# Patient Record
Sex: Female | Born: 1980 | Race: Black or African American | Hispanic: No | Marital: Married | Smoking: Never smoker
Health system: Southern US, Community
[De-identification: ages and names within clinical notes are randomized; demographics above are authoritative.]

## PROBLEM LIST (undated history)

## (undated) DIAGNOSIS — J45909 Unspecified asthma, uncomplicated: Secondary | ICD-10-CM

## (undated) DIAGNOSIS — M1990 Unspecified osteoarthritis, unspecified site: Secondary | ICD-10-CM

---

## 2016-11-24 ENCOUNTER — Emergency Department (HOSPITAL_COMMUNITY): Payer: Self-pay

## 2016-11-24 ENCOUNTER — Emergency Department (HOSPITAL_COMMUNITY)
Admission: EM | Admit: 2016-11-24 | Discharge: 2016-11-24 | Disposition: A | Discharge status: Home / Self Care | Payer: Self-pay | Attending: Emergency Medicine | Admitting: Emergency Medicine

## 2016-11-24 ENCOUNTER — Encounter (HOSPITAL_COMMUNITY): Payer: Self-pay | Admitting: Emergency Medicine

## 2016-11-24 DIAGNOSIS — J45909 Unspecified asthma, uncomplicated: Secondary | ICD-10-CM | POA: Insufficient documentation

## 2016-11-24 DIAGNOSIS — R519 Headache, unspecified: Secondary | ICD-10-CM

## 2016-11-24 DIAGNOSIS — M5481 Occipital neuralgia: Secondary | ICD-10-CM | POA: Insufficient documentation

## 2016-11-24 DIAGNOSIS — R930 Abnormal findings on diagnostic imaging of skull and head, not elsewhere classified: Secondary | ICD-10-CM | POA: Insufficient documentation

## 2016-11-24 DIAGNOSIS — M436 Torticollis: Secondary | ICD-10-CM | POA: Insufficient documentation

## 2016-11-24 DIAGNOSIS — R51 Headache: Secondary | ICD-10-CM

## 2016-11-24 HISTORY — DX: Unspecified asthma, uncomplicated: J45.909

## 2016-11-24 HISTORY — DX: Unspecified osteoarthritis, unspecified site: M19.90

## 2016-11-24 LAB — CBC WITH DIFFERENTIAL/PLATELET
BASOS ABS: 0 10*3/uL (ref 0.0–0.1)
BASOS PCT: 0 %
EOS ABS: 0.2 10*3/uL (ref 0.0–0.7)
EOS PCT: 2 %
HCT: 34.8 % — ABNORMAL LOW (ref 36.0–46.0)
HEMOGLOBIN: 11.2 g/dL — AB (ref 12.0–15.0)
Lymphocytes Relative: 30 %
Lymphs Abs: 3.3 10*3/uL (ref 0.7–4.0)
MCH: 24.2 pg — ABNORMAL LOW (ref 26.0–34.0)
MCHC: 32.2 g/dL (ref 30.0–36.0)
MCV: 75.3 fL — ABNORMAL LOW (ref 78.0–100.0)
Monocytes Absolute: 0.7 10*3/uL (ref 0.1–1.0)
Monocytes Relative: 6 %
NEUTROS PCT: 62 %
Neutro Abs: 6.8 10*3/uL (ref 1.7–7.7)
PLATELETS: 259 10*3/uL (ref 150–400)
RBC: 4.62 MIL/uL (ref 3.87–5.11)
RDW: 15.6 % — ABNORMAL HIGH (ref 11.5–15.5)
WBC: 11 10*3/uL — AB (ref 4.0–10.5)

## 2016-11-24 LAB — CSF CELL COUNT WITH DIFFERENTIAL
RBC COUNT CSF: 3845 /mm3 — AB
RBC COUNT CSF: 570 /mm3 — AB
TUBE #: 1
TUBE #: 4
WBC CSF: 2 /mm3 (ref 0–5)
WBC, CSF: 3 /mm3 (ref 0–5)

## 2016-11-24 LAB — BASIC METABOLIC PANEL
Anion gap: 7 (ref 5–15)
BUN: 9 mg/dL (ref 6–20)
CHLORIDE: 104 mmol/L (ref 101–111)
CO2: 27 mmol/L (ref 22–32)
CREATININE: 0.65 mg/dL (ref 0.44–1.00)
Calcium: 8.9 mg/dL (ref 8.9–10.3)
Glucose, Bld: 106 mg/dL — ABNORMAL HIGH (ref 65–99)
POTASSIUM: 3.6 mmol/L (ref 3.5–5.1)
SODIUM: 138 mmol/L (ref 135–145)

## 2016-11-24 LAB — PROTEIN, CSF: TOTAL PROTEIN, CSF: 34 mg/dL (ref 15–45)

## 2016-11-24 LAB — GLUCOSE, CSF: GLUCOSE CSF: 50 mg/dL (ref 40–70)

## 2016-11-24 MED ORDER — BETAMETHASONE SOD PHOS & ACET 6 (3-3) MG/ML IJ SUSP
12.0000 mg | Freq: Once | INTRAMUSCULAR | Status: AC
  Administered 2016-11-24: 12 mg via INTRAMUSCULAR
  Filled 2016-11-24: qty 2

## 2016-11-24 MED ORDER — HYDROMORPHONE HCL 1 MG/ML IJ SOLN
1.0000 mg | Freq: Once | INTRAMUSCULAR | Status: AC
  Administered 2016-11-24: 1 mg via INTRAVENOUS
  Filled 2016-11-24: qty 1

## 2016-11-24 MED ORDER — DIAZEPAM 5 MG/ML IJ SOLN
5.0000 mg | Freq: Once | INTRAMUSCULAR | Status: AC
  Administered 2016-11-24: 5 mg via INTRAVENOUS
  Filled 2016-11-24: qty 2

## 2016-11-24 MED ORDER — HYDROCODONE-ACETAMINOPHEN 5-325 MG PO TABS
1.0000 | ORAL_TABLET | Freq: Once | ORAL | Status: AC
  Administered 2016-11-24: 1 via ORAL
  Filled 2016-11-24: qty 1

## 2016-11-24 MED ORDER — IOPAMIDOL (ISOVUE-370) INJECTION 76%
INTRAVENOUS | Status: AC
  Administered 2016-11-24: 50 mL
  Filled 2016-11-24: qty 50

## 2016-11-24 MED ORDER — MELOXICAM 15 MG PO TABS: 15.0000 mg | ORAL_TABLET | Freq: Every day | ORAL | 0 refills | Status: AC

## 2016-11-24 MED ORDER — BUPIVACAINE HCL 0.25 % IJ SOLN
10.0000 mL | Freq: Once | INTRAMUSCULAR | Status: AC
  Administered 2016-11-24: 10 mL
  Filled 2016-11-24: qty 10

## 2016-11-24 MED ORDER — ONDANSETRON HCL 4 MG/2ML IJ SOLN
4.0000 mg | Freq: Once | INTRAMUSCULAR | Status: AC
  Administered 2016-11-24: 4 mg via INTRAVENOUS
  Filled 2016-11-24: qty 2

## 2016-11-24 MED ORDER — KETOROLAC TROMETHAMINE 30 MG/ML IJ SOLN
30.0000 mg | Freq: Once | INTRAMUSCULAR | Status: AC
Start: 2016-11-24 — End: 2016-11-24
  Administered 2016-11-24: 30 mg via INTRAVENOUS
  Filled 2016-11-24: qty 1

## 2016-11-24 MED ORDER — GADOBENATE DIMEGLUMINE 529 MG/ML IV SOLN
20.0000 mL | Freq: Once | INTRAVENOUS | Status: AC | PRN
  Administered 2016-11-24: 20 mL via INTRAVENOUS

## 2016-11-24 MED ORDER — BACLOFEN 20 MG PO TABS
20.0000 mg | ORAL_TABLET | Freq: Three times a day (TID) | ORAL | Status: DC
  Administered 2016-11-24: 20 mg via ORAL
  Filled 2016-11-24 (×2): qty 1

## 2016-11-24 MED ORDER — TRAMADOL HCL 50 MG PO TABS: 50.0000 mg | ORAL_TABLET | Freq: Four times a day (QID) | ORAL | 0 refills | Status: AC | PRN

## 2016-11-24 MED ORDER — PROMETHAZINE HCL 25 MG/ML IJ SOLN
25.0000 mg | Freq: Once | INTRAMUSCULAR | Status: AC
  Administered 2016-11-24: 25 mg via INTRAVENOUS
  Filled 2016-11-24: qty 1

## 2016-11-24 MED ORDER — LIDOCAINE HCL (PF) 1 % IJ SOLN
5.0000 mL | Freq: Once | INTRAMUSCULAR | Status: AC
  Administered 2016-11-24: 5 mL via INTRADERMAL
  Filled 2016-11-24: qty 5

## 2016-11-24 MED ORDER — METHOCARBAMOL 500 MG PO TABS
750.0000 mg | ORAL_TABLET | Freq: Once | ORAL | Status: AC
  Administered 2016-11-24: 750 mg via ORAL
  Filled 2016-11-24: qty 2

## 2016-11-24 MED ORDER — BACLOFEN 10 MG PO TABS: 10.0000 mg | ORAL_TABLET | Freq: Three times a day (TID) | ORAL | 0 refills | Status: AC

## 2016-11-24 NOTE — ED Provider Notes (Signed)
10:50 AM BP (!) 147/92   Pulse 72   Temp 97.5 F (36.4 C) (Oral)   Resp 16   LMP 11/24/2016   SpO2 100%  Patient with severe HA- CT-CTA showed vasculitis LP is pending. MRI/MRA- w/wo pending  Will need to consult Neurology. Patient's opening pressure was also 28.   10:50 AM  BP (!) 147/92   Pulse 72   Temp 97.5 F (36.4 C) (Oral)   Resp 16   LMP 11/24/2016   SpO2 100%   Patient here with headache. Her MRI/MRA are negative for any abnormality including a vasculitis. Patient's opening pressure on lumbar puncture was 28. I spoke with Dr. Roxy Mannsster . He states that this is not high enough to qualify for idiopathic intracranial hypertension. Of note, the patient's white blood cell count was higher on the fourth tube, and I spoke with Roxy Horsemanobert Browning. He states that he readjusted the needle just prior to collecting the fourth tube and noticed it was more blood and felt that this was positional.  Dr. Roxy Mannsster feels that the patient should be treated as a migraine headache.  On my evaluation the patient has significant paioutpatient, especially in the he right cervical paraspinal muscles. She is complaining of pain that radiates around the scalp and is sharp. I question if this is potentially an occipital neuralgia. I ordered Celestone and lidocaine and patient has agreed to try an occipital nerve block. Patient will also be given baclofen.      10:50 AM .Nerve Block Date/Time: 11/24/2016 10:44 AM Performed by: Arthor CaptainHARRIS, Starlyn Droge Authorized by: Arthor CaptainHARRIS, Keen Ewalt   Consent:    Consent obtained:  Verbal   Consent given by:  Patient   Risks discussed:  Infection, allergic reaction, bleeding, pain, swelling and unsuccessful block   Alternatives discussed:  No treatment Indications:    Indications:  Pain relief Location:    Body area:  Head   Head nerve blocked: Greater and lesser occipital. Pre-procedure details:    Skin preparation:  2% chlorhexidine   Preparation: Patient was prepped and  draped in usual sterile fashion   Procedure details (see MAR for exact dosages):    Block needle gauge:  25 G   Anesthetic injected:  Bupivacaine 0.25% w/o epi   Steroid injected:  Betamethasone   Injection procedure:  Anatomic landmarks identified and negative aspiration for blood Post-procedure details:    Dressing: bandaid.   Outcome: pain and ROM improved.   Patient tolerated the procedure well. I do think this represents acute torticollis and potentially occipital neuralgia given the shooting pain. She feels around the right scalp. Patient treated with betamethasone and bupivacaine nerve block. Patient given baclofen and tramadol along with Mobitz at discharge. I have given patient discharge instructions and return precautions regarding her lumbar puncture, headache and occipital neuralgia findings. She otherwise appears medically clear for any emergent cause of headache at this time.    Arthor CaptainHarris, Orlan Aversa, PA-C 11/24/16 1052    Derwood KaplanNanavati, Ankit, MD 11/25/16 [phone]

## 2016-11-24 NOTE — ED Notes (Signed)
Patient transported to CT 

## 2016-11-24 NOTE — ED Triage Notes (Signed)
Pt presents with neck stiffness and pain x 2 days; pt reports taking OTC pain relievers without improvement; pt denies injury, fever, n/v

## 2016-11-24 NOTE — ED Notes (Signed)
Patient at MRI 

## 2016-11-24 NOTE — ED Provider Notes (Addendum)
Patient signed out to me by Damian Leavell, NP.  Patient with severe headache. Worse than she has ever experienced before.  CT angio cervical spine and head ordered. We'll follow up on these tests.  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 Will consult neurology.    Spoke with Dr. Otelia Limes, who recommends MRA and MRI and LP.    Physical Exam  BP (!) 146/84   Pulse 82   Temp 97.5 F (36.4 C) (Oral)   Resp 16   LMP 11/24/2016   SpO2 100%   Physical Exam  Constitutional: She is oriented to person, place, and time. She appears well-developed and well-nourished.  HENT:  Head: Normocephalic and atraumatic.  Right Ear: External ear normal.  Left Ear: External ear normal.  Eyes: Conjunctivae and EOM are normal. Pupils are equal, round, and reactive to light.  Neck: Normal range of motion. Neck supple.  No pain with neck flexion, no meningismus  Cardiovascular: Normal rate, regular rhythm and normal heart sounds.  Exam reveals no gallop and no friction rub.   No murmur heard. Pulmonary/Chest: Effort normal and breath sounds normal. No respiratory distress. She has no wheezes. She has no rales. She exhibits no tenderness.  Abdominal: Soft. She exhibits no distension and no mass. There is no tenderness. There is no rebound and no guarding.  Musculoskeletal: Normal range of motion. She exhibits no edema or tenderness.  Normal gait.  Neurological: She is alert and oriented to person, place, and time. She has normal reflexes.  CN 3-12 intact, normal finger to nose, no pronator drift, sensation and strength intact bilaterally.  Skin: Skin is warm and dry.  Psychiatric: She has a normal mood and affect. Her behavior is normal. Judgment and thought content normal.  Nursing note and vitals reviewed.   ED Course  .Lumbar Puncture Date/Time: 11/24/2016 6:02 AM Performed by: Roxy Horseman Authorized by: Roxy Horseman   Consent:    Consent obtained:  Verbal  Consent given by:  Patient   Risks discussed:  Headache   Alternatives discussed:  No treatment Pre-procedure details:    Procedure purpose:  Diagnostic   Preparation: Patient was prepped and draped in usual sterile fashion   Anesthesia (see MAR for exact dosages):    Anesthesia method:  Local infiltration   Local anesthetic:  Lidocaine 1% w/o epi Procedure details:    Lumbar space:  L4-L5 interspace   Patient position:  L lateral decubitus   Needle gauge:  18   Needle type:  Spinal needle - Quincke tip   Needle length (in):  3.5   Ultrasound guidance: no     Number of attempts:  1   Opening pressure (cm H2O):  28   Closing pressure (cm H2O):  26   Fluid appearance:  Clear   Tubes of fluid:  4   Total volume (ml):  4 Post-procedure:    Puncture site:  Adhesive bandage applied   Patient tolerance of procedure:  Tolerated well, no immediate complications    MDM  Patient with findings concerning for vasculitis. Neurology recommends additional imaging as lumbar puncture.  Patient signed out to oncoming team Harris, PA-C.  Will need to speak with neurology following MRI results.  Consider also pseudotumor cerebri given elevated opening pressure of 28.  Tube 4 was rose colored and CSF had ceased flowing.  This is thought to be due to patient repositioning and needle likely coming out of canal.  Roxy HorsemanBrowning, Bryar Dahms, PA-C 11/24/16 2225    Ward, Layla MawKristen N, DO 11/27/16 Newton [id number]

## 2016-11-24 NOTE — Discharge Instructions (Signed)

## 2016-11-24 NOTE — ED Notes (Signed)
Patient requesting pain med. PA aware.

## 2016-11-24 NOTE — ED Notes (Signed)
Patient transported to MRI 

## 2016-11-24 NOTE — Consult Note (Signed)
NEURO HOSPITALIST CONSULT NOTE   Requestig physician: Dr. Elesa Massed  Reason for Consult: Severe neck pain progressing to include severe right sided headache  History obtained from:   Patient and Chart   HPI:                                                                                                                                          Angela Cherry is an 36 y.o. female who presented to the ED with severe right sided neck pain that started on Saturday and then worsened to include severe right posterior and lateral headache on the right. The pain is 10/10, occasionally pounding and described as feeling like her head is going to explode. Has had photophobia. Denies weakness, numbness, vision loss, confusion, chest pain, SOB or limb pain. She has a prior history of migraine headaches but states that this headache is different.   CTA head and neck was obtained and was somewhat technically compromised per Radiology, with question of luminal irregularity within the bilateral MCAs and ACAs suggestive of possible vasculitis.   Subsequently, MRI brain was obtained, which was negative. MRA of head also revealed normal arterial appearance without evidence of vasculitis.   LP was performed by ED physician, revealing an elevated opening pressure of 28. Labs were obtained to assess for possible vasculitis.   Past Medical History:  Diagnosis Date  . Arthritis   . Asthma     History reviewed. No pertinent surgical history.  History reviewed. No pertinent family history.  Social History:  reports that she has never smoked. She has never used smokeless tobacco. She reports that she does not drink alcohol or use drugs.  Allergies  Allergen Reactions  . Penicillins Hives    MEDICATIONS:                                                                                                                     No home medications listed.   ROS:  As per HPI.   Blood pressure 122/83, pulse 60, temperature 97.5 F (36.4 C), temperature source Oral, resp. rate 16, last menstrual period 11/24/2016, SpO2 97 %.  General Examination:                                                                                                      HEENT-  [place]/AT. Severe TTP posterior nuchal musculature and temporalis muscles, right worse than left Lungs- Respirations unlabored Extremities- No edema.   Neurological Examination Mental Status: Alert, oriented, thought content appropriate.  Speech fluent without evidence of aphasia.  Able to follow 3 step commands without difficulty. Cranial Nerves: II: Visual intact, PERRL III,IV, VI: ptosis not present, EOMI without nystagmus or CN6 deficit V,VII: smile symmetric, facial temp sensation normal bilaterally VIII: hearing intact to voice IX,X: no hypophonia XI: symmetric shoulder shrug XII: midline tongue extension Motor: Right : Upper extremity   5/5    Left:     Upper extremity   5/5  Lower extremity   5/5     Lower extremity   5/5 Tone and bulk:normal tone throughout; no atrophy noted Sensory: Temp and light touch intact throughout, bilaterally Deep Tendon Reflexes: Normoactive x 4 Plantars: Right: downgoing   Left: downgoing Cerebellar: No ataxia with FNF bilaterally  Gait: Deferred  Lab Results: Basic Metabolic Panel:  Recent Labs Lab 11/24/16 0140  NA 138  K 3.6  CL 104  CO2 27  GLUCOSE 106*  BUN 9  CREATININE 0.65  CALCIUM 8.9    Liver Function Tests: No results for input(s): AST, ALT, ALKPHOS, BILITOT, PROT, ALBUMIN in the last 168 hours. No results for input(s): LIPASE, AMYLASE in the last 168 hours. No results for input(s): AMMONIA in the last 168 hours.  CBC:  Recent Labs Lab 11/24/16 0140  WBC 11.0*  NEUTROABS 6.8  HGB 11.2*  HCT 34.8*  MCV 75.3*  PLT 259     Cardiac Enzymes: No results for input(s): CKTOTAL, CKMB, CKMBINDEX, TROPONINI in the last 168 hours.  Lipid Panel: No results for input(s): CHOL, TRIG, HDL, CHOLHDL, VLDL, LDLCALC in the last 168 hours.  CBG: No results for input(s): GLUCAP in the last 168 hours.  Microbiology: Results for orders placed or performed during the hospital encounter of 11/24/16  CSF culture     Status: None (Preliminary result)   Collection Time: 11/24/16  4:24 AM  Result Value Ref Range Status   Specimen Description CSF  Final   Special Requests NONE  Final   Gram Stain   Final    WBC PRESENT, PREDOMINANTLY PMN NO ORGANISMS SEEN CYTOSPIN SMEAR    Culture PENDING  Incomplete   Report Status PENDING  Incomplete    Coagulation Studies: No results for input(s): LABPROT, INR in the last 72 hours.  Imaging: Ct Angio Head W Or Wo Contrast  Result Date: 11/24/2016 CLINICAL DATA:  Severe headache radiating to back of neck for 2 days. History of migraines. EXAM: CT ANGIOGRAPHY HEAD AND NECK TECHNIQUE: Multidetector CT imaging of the head and neck was performed using the standard protocol  during bolus administration of intravenous contrast. Multiplanar CT image reconstructions and MIPs were obtained to evaluate the vascular anatomy. Carotid stenosis measurements (when applicable) are obtained utilizing NASCET criteria, using the distal internal carotid diameter as the denominator. CONTRAST:  50 cc Isovue 370 COMPARISON:  None. FINDINGS: CT HEAD FINDINGS BRAIN: No intraparenchymal hemorrhage, mass effect nor midline shift. The ventricles and sulci are normal. No acute large vascular territory infarcts. No abnormal extra-axial fluid collections. Basal cisterns are patent. VASCULAR: Unremarkable. SKULL/SOFT TISSUES: No skull fracture. No significant soft tissue swelling. ORBITS/SINUSES: The included ocular globes and orbital contents are normal.The mastoid aircells and included paranasal sinuses are well-aerated.  OTHER: None. Large body habitus results in overall noisy image quality. CTA NECK- delayed venous phase. AORTIC ARCH: Normal appearance of the thoracic arch, 2 vessel arch is a normal variant. The origins of the innominate, left Common carotid artery and subclavian artery are widely patent. RIGHT CAROTID SYSTEM: Common carotid artery is widely patent. Normal appearance of the carotid bifurcation without hemodynamically significant stenosis by NASCET criteria. Normal appearance of the included internal carotid artery, retropharyngeal course may be transient. LEFT CAROTID SYSTEM: Common carotid artery is widely patent. Normal appearance of the carotid bifurcation without hemodynamically significant stenosis by NASCET criteria. Normal appearance of the included internal carotid artery, retropharyngeal course may be transient. VERTEBRAL ARTERIES:Left vertebral artery is dominant. Normal appearance of the vertebral arteries, which appear widely patent. SKELETON: No acute osseous process though bone windows have not been submitted. OTHER NECK: Soft tissues of the neck are non-acute though, not tailored for evaluation. CTA HEAD- delayed venous phase. ANTERIOR CIRCULATION: Patent cervical internal carotid arteries, petrous, cavernous and supra clinoid internal carotid arteries. Widely patent anterior communicating artery. Patent anterior and middle cerebral arteries with moderate luminal irregularity. No large vessel occlusion, hemodynamically significant stenosis, dissection, contrast extravasation or aneurysm. POSTERIOR CIRCULATION: Patent vertebral arteries, vertebrobasilar junction and basilar artery, as well as main branch vessels. Patent posterior cerebral arteries with moderate luminal irregularity. No large vessel occlusion, hemodynamically significant stenosis, dissection, contrast extravasation or aneurysm. VENOUS SINUSES: Major dural venous sinuses are patent though not tailored for evaluation on this angiographic  examination. ANATOMIC VARIANTS: None. DELAYED PHASE: No abnormal intracranial enhancement. MIP images reviewed. IMPRESSION: CT HEAD:  Normal. CTA NECK: Limited by body habitus and delayed phase, no hemodynamically significant stenosis or acute vascular process. CTA HEAD: Limited by delayed phase. Cerebral artery luminal irregularity concerning for vasculitis, less likely atherosclerosis. Recommend MRI of the head with and without contrast and MRA for further characterization. No emergent large vessel occlusion or severe stenosis. Electronically Signed   By: Awilda Metro M.D.   On: 11/24/2016 03:40   Ct Angio Neck W Or Wo Contrast  Result Date: 11/24/2016 CLINICAL DATA:  Severe headache radiating to back of neck for 2 days. History of migraines. EXAM: CT ANGIOGRAPHY HEAD AND NECK TECHNIQUE: Multidetector CT imaging of the head and neck was performed using the standard protocol during bolus administration of intravenous contrast. Multiplanar CT image reconstructions and MIPs were obtained to evaluate the vascular anatomy. Carotid stenosis measurements (when applicable) are obtained utilizing NASCET criteria, using the distal internal carotid diameter as the denominator. CONTRAST:  50 cc Isovue 370 COMPARISON:  None. FINDINGS: CT HEAD FINDINGS BRAIN: No intraparenchymal hemorrhage, mass effect nor midline shift. The ventricles and sulci are normal. No acute large vascular territory infarcts. No abnormal extra-axial fluid collections. Basal cisterns are patent. VASCULAR: Unremarkable. SKULL/SOFT TISSUES: No skull fracture. No significant soft tissue swelling.  ORBITS/SINUSES: The included ocular globes and orbital contents are normal.The mastoid aircells and included paranasal sinuses are well-aerated. OTHER: None. Large body habitus results in overall noisy image quality. CTA NECK- delayed venous phase. AORTIC ARCH: Normal appearance of the thoracic arch, 2 vessel arch is a normal variant. The origins of the  innominate, left Common carotid artery and subclavian artery are widely patent. RIGHT CAROTID SYSTEM: Common carotid artery is widely patent. Normal appearance of the carotid bifurcation without hemodynamically significant stenosis by NASCET criteria. Normal appearance of the included internal carotid artery, retropharyngeal course may be transient. LEFT CAROTID SYSTEM: Common carotid artery is widely patent. Normal appearance of the carotid bifurcation without hemodynamically significant stenosis by NASCET criteria. Normal appearance of the included internal carotid artery, retropharyngeal course may be transient. VERTEBRAL ARTERIES:Left vertebral artery is dominant. Normal appearance of the vertebral arteries, which appear widely patent. SKELETON: No acute osseous process though bone windows have not been submitted. OTHER NECK: Soft tissues of the neck are non-acute though, not tailored for evaluation. CTA HEAD- delayed venous phase. ANTERIOR CIRCULATION: Patent cervical internal carotid arteries, petrous, cavernous and supra clinoid internal carotid arteries. Widely patent anterior communicating artery. Patent anterior and middle cerebral arteries with moderate luminal irregularity. No large vessel occlusion, hemodynamically significant stenosis, dissection, contrast extravasation or aneurysm. POSTERIOR CIRCULATION: Patent vertebral arteries, vertebrobasilar junction and basilar artery, as well as main branch vessels. Patent posterior cerebral arteries with moderate luminal irregularity. No large vessel occlusion, hemodynamically significant stenosis, dissection, contrast extravasation or aneurysm. VENOUS SINUSES: Major dural venous sinuses are patent though not tailored for evaluation on this angiographic examination. ANATOMIC VARIANTS: None. DELAYED PHASE: No abnormal intracranial enhancement. MIP images reviewed. IMPRESSION: CT HEAD:  Normal. CTA NECK: Limited by body habitus and delayed phase, no  hemodynamically significant stenosis or acute vascular process. CTA HEAD: Limited by delayed phase. Cerebral artery luminal irregularity concerning for vasculitis, less likely atherosclerosis. Recommend MRI of the head with and without contrast and MRA for further characterization. No emergent large vessel occlusion or severe stenosis. Electronically Signed   By: Awilda Metro M.D.   On: 11/24/2016 03:40   Mr Angiogram Head W Or Wo Contrast  Result Date: 11/24/2016 CLINICAL DATA:  36 year old female with severe headache radiating to the neck for 2 days. Query vasculitis. EXAM: MRI HEAD WITHOUT AND WITH CONTRAST MRA HEAD WITHOUT CONTRAST TECHNIQUE: Multiplanar, multiecho pulse sequences of the brain and surrounding structures were obtained without and with intravenous contrast. Angiographic images of the head were obtained using MRA technique without contrast. CONTRAST:  20mL MULTIHANCE GADOBENATE DIMEGLUMINE 529 MG/ML IV SOLN COMPARISON:  CT head without contrast, and CTA head and neck from 0253 hours today. FINDINGS: MRI HEAD FINDINGS Brain: Cerebral volume is within normal limits. No restricted diffusion to suggest acute infarction. No midline shift, mass effect, evidence of mass lesion, ventriculomegaly, extra-axial collection or acute intracranial hemorrhage. Cervicomedullary junction and pituitary are within normal limits. Wallace Cullens and white matter signal is within normal limits throughout the brain. No encephalomalacia or chronic cerebral blood products identified. No abnormal enhancement identified. No dural thickening. Vascular: Major intracranial vascular flow voids are preserved. Major dural venous sinuses are patent and enhancing. Skull and upper cervical spine: Mild degenerative appearing ligamentous hypertrophy about the odontoid. Otherwise negative. Visualized bone marrow signal is within normal limits. Sinuses/Orbits: Orbits soft tissues are within normal limits. Visualized paranasal sinuses and  mastoids are stable and well pneumatized. Other: Visible internal auditory structures appear normal. Negative scalp soft tissues. MRA HEAD  FINDINGS Intracranial arterial detail degraded by motion artifact, but primarily at the skullbase. The medium size vessel detail is better preserved. Antegrade flow in the posterior circulation with mildly dominant distal left vertebral artery. No distal vertebral or vertebrobasilar stenosis. Mildly tortuous basilar artery without stenosis. SCA and PCA origins are normal. Posterior communicating arteries are diminutive or absent. Bilateral PCA branches appear normal. Antegrade flow in both ICA siphons. Mildly tortuous ICAs. No siphon stenosis. Patent carotid termini. Normal MCA and ACA origins. Anterior communicating artery and visible ACA branches are normal. Left MCA M1 segment, bifurcation, and left MCA branches appear normal. Right MCA bifurcates early. Right MCA M1 and visible right MCA branches appear normal. IMPRESSION: 1. Normal arterial appearance on intracranial MRA, without evidence of vasculitis. No intracranial stenosis or aneurysm. 2.  Normal MRI appearance of the brain. Electronically Signed   By: Odessa FlemingH  Hall M.D.   On: 11/24/2016 07:47   Mr Laqueta JeanBrain W And Wo Contrast  Result Date: 11/24/2016 CLINICAL DATA:  36 year old female with severe headache radiating to the neck for 2 days. Query vasculitis. EXAM: MRI HEAD WITHOUT AND WITH CONTRAST MRA HEAD WITHOUT CONTRAST TECHNIQUE: Multiplanar, multiecho pulse sequences of the brain and surrounding structures were obtained without and with intravenous contrast. Angiographic images of the head were obtained using MRA technique without contrast. CONTRAST:  20mL MULTIHANCE GADOBENATE DIMEGLUMINE 529 MG/ML IV SOLN COMPARISON:  CT head without contrast, and CTA head and neck from 0253 hours today. FINDINGS: MRI HEAD FINDINGS Brain: Cerebral volume is within normal limits. No restricted diffusion to suggest acute infarction. No  midline shift, mass effect, evidence of mass lesion, ventriculomegaly, extra-axial collection or acute intracranial hemorrhage. Cervicomedullary junction and pituitary are within normal limits. Wallace CullensGray and white matter signal is within normal limits throughout the brain. No encephalomalacia or chronic cerebral blood products identified. No abnormal enhancement identified. No dural thickening. Vascular: Major intracranial vascular flow voids are preserved. Major dural venous sinuses are patent and enhancing. Skull and upper cervical spine: Mild degenerative appearing ligamentous hypertrophy about the odontoid. Otherwise negative. Visualized bone marrow signal is within normal limits. Sinuses/Orbits: Orbits soft tissues are within normal limits. Visualized paranasal sinuses and mastoids are stable and well pneumatized. Other: Visible internal auditory structures appear normal. Negative scalp soft tissues. MRA HEAD FINDINGS Intracranial arterial detail degraded by motion artifact, but primarily at the skullbase. The medium size vessel detail is better preserved. Antegrade flow in the posterior circulation with mildly dominant distal left vertebral artery. No distal vertebral or vertebrobasilar stenosis. Mildly tortuous basilar artery without stenosis. SCA and PCA origins are normal. Posterior communicating arteries are diminutive or absent. Bilateral PCA branches appear normal. Antegrade flow in both ICA siphons. Mildly tortuous ICAs. No siphon stenosis. Patent carotid termini. Normal MCA and ACA origins. Anterior communicating artery and visible ACA branches are normal. Left MCA M1 segment, bifurcation, and left MCA branches appear normal. Right MCA bifurcates early. Right MCA M1 and visible right MCA branches appear normal. IMPRESSION: 1. Normal arterial appearance on intracranial MRA, without evidence of vasculitis. No intracranial stenosis or aneurysm. 2.  Normal MRI appearance of the brain. Electronically Signed    By: Odessa FlemingH  Hall M.D.   On: 11/24/2016 07:47    Assessment: 1. Severe neck pain and right sided headache. DDx includes severe migraine headache, but most likely etiology is pseudotumor cerebri (morbidly obese with elevated OP of 28). A component of the pain is also most likely due to muscle spasm.  2. CSF was bloody tap,  but cell count and protein not consistent with vasculitis. IgG index is pending.  3. Follow up MRI/MRA are also not consistent with vasculitis.   Recommendations: 1. Phenergan 25 mg IV x 1 2. Toradol 30 mg IV x 1 3. Valium 5 mg IV x 1 4. Consider a trial of Diamox 5. Send to ophthalmology for measurement of visual fields with [facility] perimetry, in addition to retinal exam 6. Follow up should be scheduled with Neurology in 1-2 weeks for second opinion regarding pseudotumor cerebri, as well as to follow up IgG index (if IgG index is not resulted by the time she is discharged) 7. Referral to weight loss clinic 8. Vitamin A level  Electronically signed: Dr. Caryl Pina 11/24/2016, 8:54 AM

## 2016-11-24 NOTE — ED Provider Notes (Signed)
[facility]-EMERGENCY DEPT Provider Note   CSN: [id number] Arrival date & time: 11/24/16  0022     History   Chief Complaint Chief Complaint  Patient presents with  . Torticollis    HPI Angela Cherry is a 36 y.o. female who presents to the ED with neck pain. She reports that the pain started yesterday and today is severe. She also complains of a severe headache that feels like it is going to explode. Patient states that she has occasional migraine headache but nothing like the pain she is currently having.   The history is provided by the patient. No language interpreter was used.    Past Medical History:  Diagnosis Date  . Arthritis     There are no active problems to display for this patient.   History reviewed. No pertinent surgical history.  OB History    No data available       Home Medications    Prior to Admission medications   Not on File    Family History History reviewed. No pertinent family history.  Social History Social History  Substance Use Topics  . Smoking status: Never Smoker  . Smokeless tobacco: Never Used  . Alcohol use No     Allergies   Penicillins   Review of Systems Review of Systems  Constitutional: Negative for chills and fever.  HENT: Negative for congestion, ear pain and sinus pressure.   Eyes: Negative for photophobia and visual disturbance.  Respiratory: Negative for cough.   Cardiovascular: Negative for chest pain.  Gastrointestinal: Negative for abdominal pain, nausea and vomiting.  Musculoskeletal: Positive for neck pain and neck stiffness.  Skin: Negative for wound.  Neurological: Positive for headaches.  Psychiatric/Behavioral: Negative for confusion.     Physical Exam Updated Vital Signs BP (!) 160/98 (BP Location: Right Arm)   Pulse 65   Temp 97.5 F (36.4 C) (Oral)   Resp 16   SpO2 100%   Physical Exam  Constitutional: She is oriented to person, place, and time. She appears well-developed and  well-nourished. No distress.  HENT:  Head: Normocephalic and atraumatic.  Right Ear: Tympanic membrane normal.  Left Ear: Tympanic membrane normal.  Nose: Nose normal.  Mouth/Throat: Uvula is midline, oropharynx is clear and moist and mucous membranes are normal.  Eyes: Conjunctivae and EOM are normal.  Neck: Spinous process tenderness and muscular tenderness present. Decreased range of motion present.  Patient will not look up or touch chin to chest due to severe pain.  Cardiovascular: Normal rate and regular rhythm.   Pulmonary/Chest: Effort normal. She has no wheezes. She has no rales.  Abdominal: There is no tenderness.  Musculoskeletal: She exhibits no edema.  Radial and pedal pulses strong, adequate circulation, good touch sensation.  Neurological: She is alert and oriented to person, place, and time. She has normal strength. She displays a negative Romberg sign. Gait normal.  Reflex Scores:      Bicep reflexes are 2+ on the right side and 2+ on the left side.      Brachioradialis reflexes are 2+ on the right side and 2+ on the left side.  Stands on one foot without difficulty.  Skin: Skin is warm and dry.  Psychiatric: She has a normal mood and affect. Her behavior is normal.  Nursing note and vitals reviewed.  After medication for pain and muscle spasm given I returned to reevaluate the patient. The patient was walking the floor holding her head and stating the pain was worse and  felt like her head was going to explode. With the increased pain, elevated BP and no response to the medication will order labs and CT.   ED Treatments / Results  Labs (all labs ordered are listed, but only abnormal results are displayed) Labs Reviewed  CBC WITH DIFFERENTIAL/PLATELET  BASIC METABOLIC PANEL    Radiology No results found.  Procedures Procedures (including critical care time)  Medications Ordered in ED Medications  methocarbamol (ROBAXIN) tablet 750 mg (750 mg Oral Given 11/24/16  0057)  HYDROcodone-acetaminophen (NORCO/VICODIN) 5-325 MG per tablet 1 tablet (1 tablet Oral Given 11/24/16 0057)     Initial Impression / Assessment and Plan / ED Course  I have reviewed the triage vital signs and the nursing notes.   Final Clinical Impressions(s) / ED Diagnoses   Final diagnoses:  Headache   Care turned over to Roxy Horseman, [facility] @ 2:00 am. Patient awaiting CT.   New Prescriptions New Prescriptions   No medications on file     Janne Napoleon, NP 11/24/16 0211    Ward, Layla Maw, DO 11/24/16 0300

## 2016-11-25 LAB — IGG CSF INDEX
ALBUMIN CSF-MCNC: 19 mg/dL (ref 11–48)
ALBUMIN: 3.7 g/dL (ref 3.5–5.5)
CSF IgG Index: 0.8 — ABNORMAL HIGH (ref 0.0–0.7)
IGG CSF: 4.7 mg/dL (ref 0.0–8.6)
IGG/ALB RATIO, CSF: 0.25 (ref 0.00–0.25)
IgG (Immunoglobin G), Serum: 1166 mg/dL (ref 700–1600)

## 2016-11-27 LAB — CSF CULTURE: CULTURE: NO GROWTH

## 2016-11-27 LAB — CSF CULTURE W GRAM STAIN

## 2017-09-25 IMAGING — MR MR HEAD WO/W CM
10 of 13 series · 28 of 48 positions shown · IV contrast (Yes MH)
Comparison: CT head without contrast, and CTA head and neck from
5386 hours today.

CLINICAL DATA: 35-year-old female with severe headache radiating to
the neck for 2 days. Query vasculitis.

EXAM:
MRI HEAD WITHOUT AND WITH CONTRAST
MRA HEAD WITHOUT CONTRAST
TECHNIQUE: Multiplanar, multiecho pulse sequences of the brain and surrounding
structures were obtained without and with intravenous contrast.
Angiographic images of the head were obtained using MRA technique
without contrast.
CONTRAST:  20mL MULTIHANCE GADOBENATE DIMEGLUMINE 529 MG/ML IV SOLN

[Series 3: FLAIR · sagittal · 5.0mm · 0.47mm/px · 2 of 23 slices shown (1 of 2)]
[im 1/23]
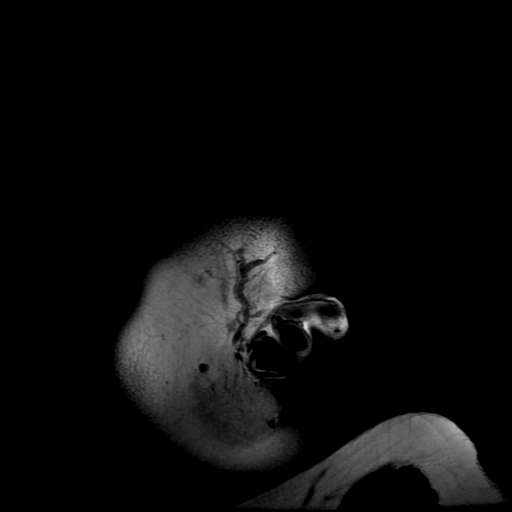
[im 23/23]
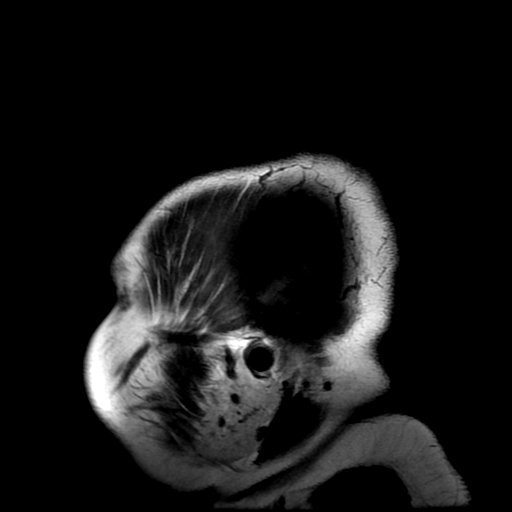

[Series 5: DWI · axial · 3.0mm · 0.94mm/px · z∈[-149,-4]mm · 6 of 100 slices shown (1 of 2)]
[im 1/100]
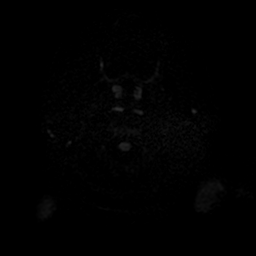
[im 20/100]
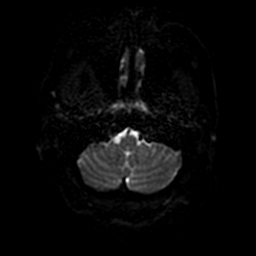
[im 40/100]
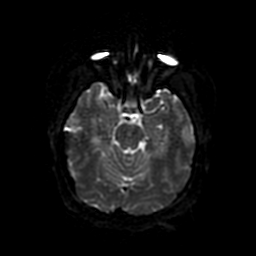
[im 60/100]
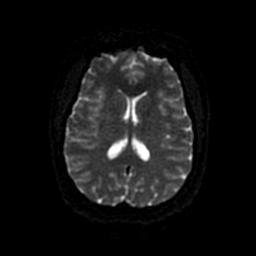
[im 80/100]
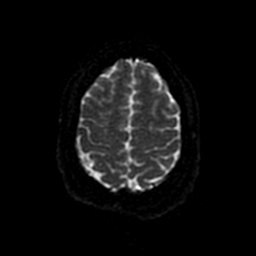
[im 100/100]
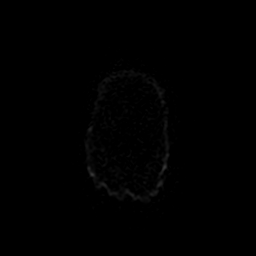

[Series 6: ax (id) 2 · axial · 1.0mm · 0.43mm/px · z∈[-133,-106]mm · 3 of 184 slices shown]
[im 1/184]
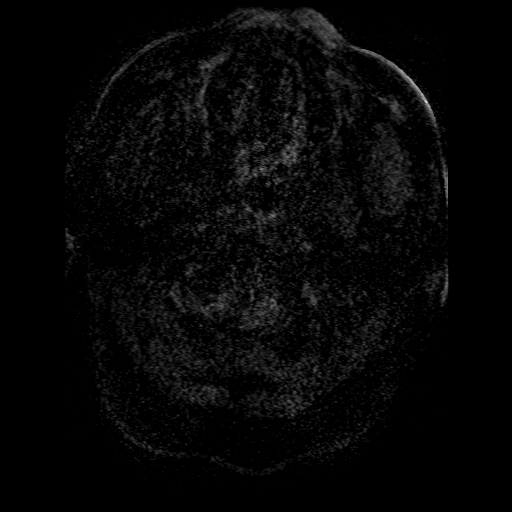
[im 37/184]
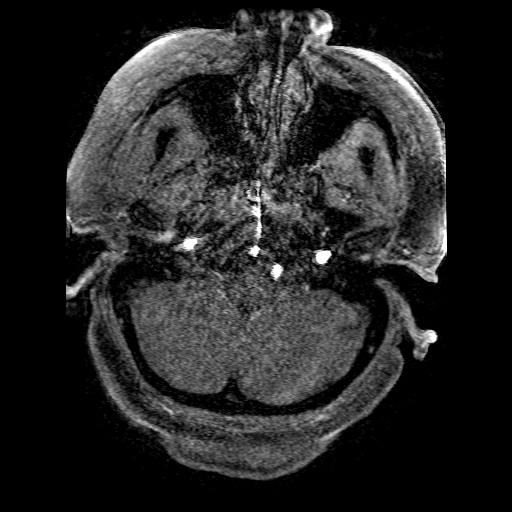
[im 55/184]
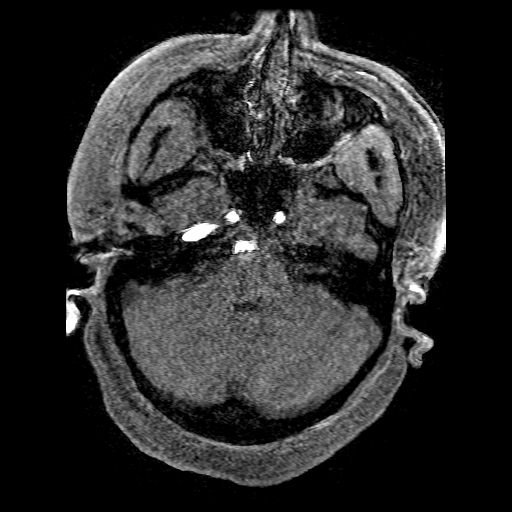

[Series 8: T2 · axial · 5.0mm · 0.47mm/px · z∈[-143,-1]mm · 2 of 25 slices shown]
[im 1/25]
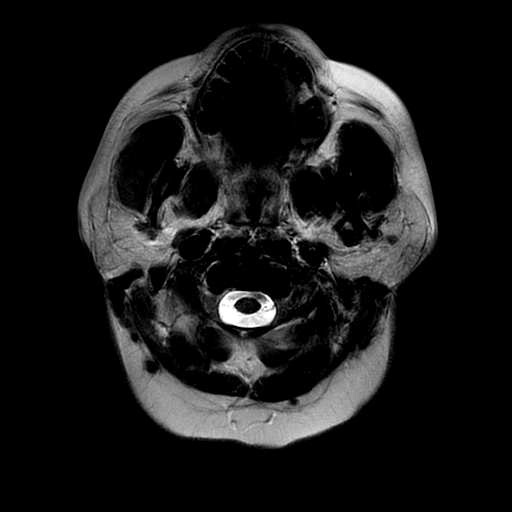
[im 25/25]
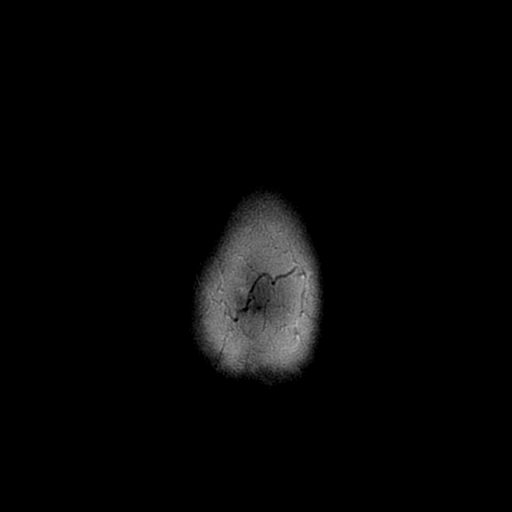

[Series 9: FLAIR · axial · 5.0mm · 0.47mm/px · z∈[-143,-1]mm · 2 of 25 slices shown (2 of 2)]
[im 1/25]
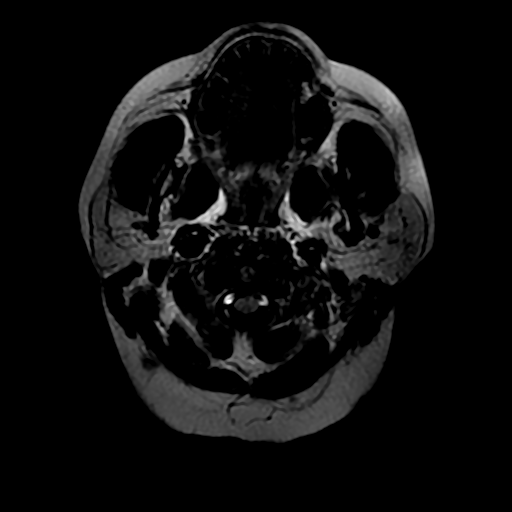
[im 25/25]
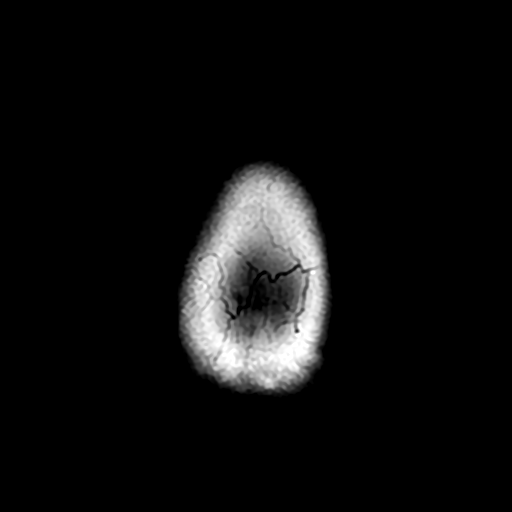

[Series 10: DWI · coronal · 4.0mm · 0.94mm/px · 4 of 72 slices shown (2 of 2)]
[im 1/72]
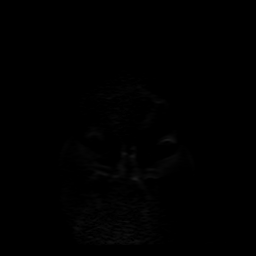
[im 24/72]
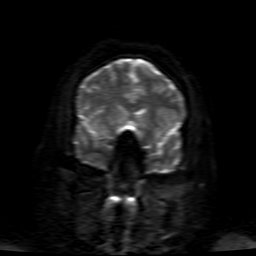
[im 48/72]
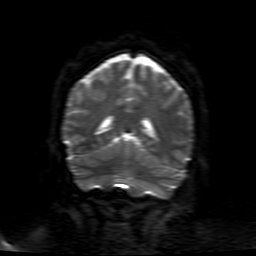
[im 72/72]
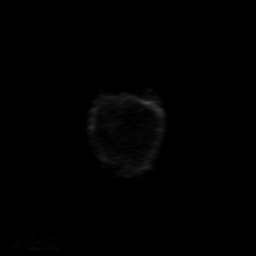

[Series 13: T2 post-contrast · coronal · 5.0mm · 0.47mm/px · 2 of 30 slices shown]
[im 1/30]
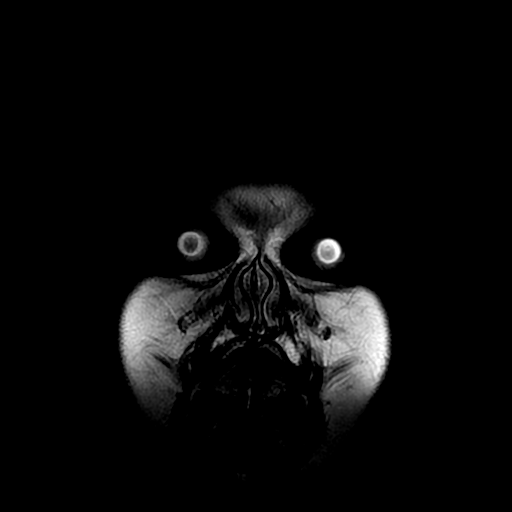
[im 30/30]
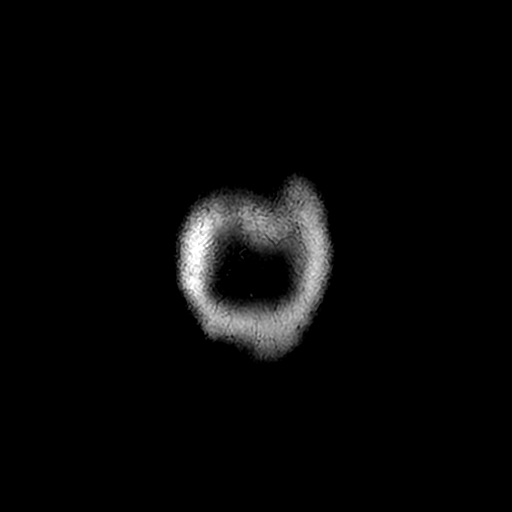

[Series 15: T1 · coronal · 5.0mm · 0.47mm/px · 2 of 30 slices shown]
[im 1/30]
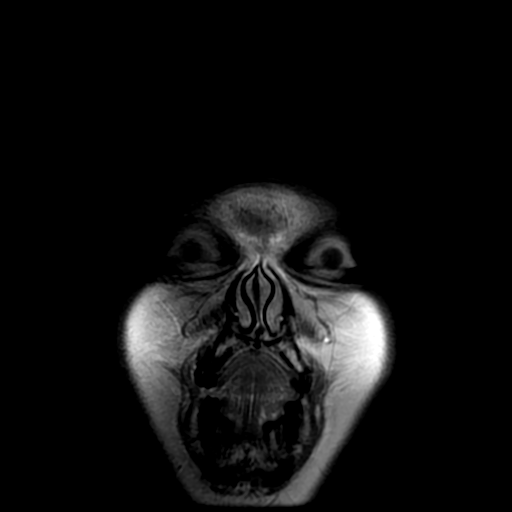
[im 30/30]
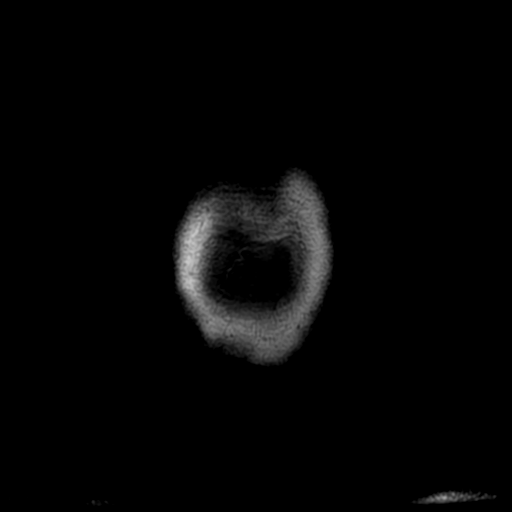

[Series 550: ADC · axial · 3.0mm · 0.94mm/px · z∈[-149,-4]mm · 3 of 50 slices shown (1 of 2)]
[im 1/50]
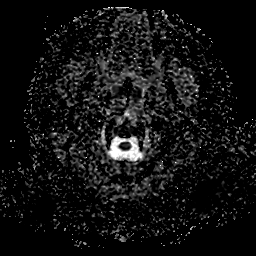
[im 25/50]
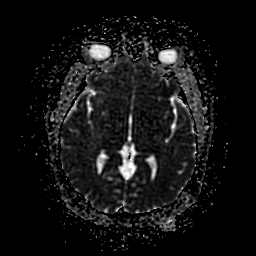
[im 50/50]
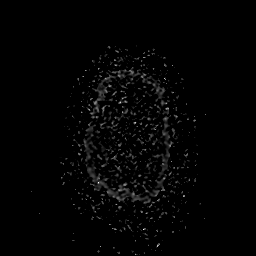

[Series 1050: ADC · coronal · 4.0mm · 0.94mm/px · 2 of 36 slices shown (2 of 2)]
[im 1/36]
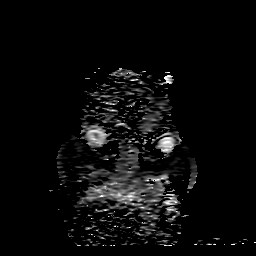
[im 36/36]
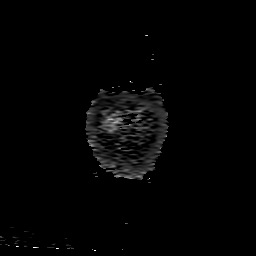

[28 of 48 positions shown; findings below may reference images not displayed]

FINDINGS: MRI HEAD FINDINGS

Brain: Cerebral volume is within normal limits. No restricted
diffusion to suggest acute infarction. No midline shift, mass
effect, evidence of mass lesion, ventriculomegaly, extra-axial
collection or acute intracranial hemorrhage. Cervicomedullary
junction and pituitary are within normal limits.

Gray and white matter signal is within normal limits throughout the
brain. No encephalomalacia or chronic cerebral blood products
identified. No abnormal enhancement identified. No dural thickening.

Vascular: Major intracranial vascular flow voids are preserved.
Major dural venous sinuses are patent and enhancing.

Skull and upper cervical spine: Mild degenerative appearing
ligamentous hypertrophy about the odontoid. Otherwise negative.
Visualized bone marrow signal is within normal limits.

Sinuses/Orbits: Orbits soft tissues are within normal limits.
Visualized paranasal sinuses and mastoids are stable and well
pneumatized.

Other: Visible internal auditory structures appear normal. Negative
scalp soft tissues.

MRA HEAD FINDINGS

Intracranial arterial detail degraded by motion artifact, but
primarily at the skullbase. The medium size vessel detail is better
preserved.

Antegrade flow in the posterior circulation with mildly dominant
distal left vertebral artery. No distal vertebral or vertebrobasilar
stenosis. Mildly tortuous basilar artery without stenosis. SCA and
PCA origins are normal. Posterior communicating arteries are
diminutive or absent. Bilateral PCA branches appear normal.

Antegrade flow in both ICA siphons. Mildly tortuous ICAs. No siphon
stenosis. Patent carotid termini. Normal MCA and ACA origins.
Anterior communicating artery and visible ACA branches are normal.
Left MCA M1 segment, bifurcation, and left MCA branches appear
normal. Right MCA bifurcates early. Right MCA M1 and visible right
MCA branches appear normal.
IMPRESSION: 1. Normal arterial appearance on intracranial MRA, without evidence
of vasculitis. No intracranial stenosis or aneurysm.
2.  Normal MRI appearance of the brain.
